# Patient Record
Sex: Female | Born: 1995 | Race: Black or African American | Hispanic: No | Marital: Single | Smoking: Former smoker
Health system: Southern US, Community
[De-identification: ages and names within clinical notes are randomized; demographics above are authoritative.]

## PROBLEM LIST (undated history)

## (undated) ENCOUNTER — Inpatient Hospital Stay (HOSPITAL_COMMUNITY): Payer: Self-pay

## (undated) DIAGNOSIS — S42309A Unspecified fracture of shaft of humerus, unspecified arm, initial encounter for closed fracture: Secondary | ICD-10-CM

## (undated) DIAGNOSIS — D649 Anemia, unspecified: Secondary | ICD-10-CM

---
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
## 2016-02-18 ENCOUNTER — Encounter (HOSPITAL_COMMUNITY): Payer: Self-pay | Admitting: *Deleted

## 2016-10-19 IMAGING — US US OB COMP LESS 14 WK
1 series · 13 of 28 positions shown · non-contrast
Comparison: None.

CLINICAL DATA: Lower abdominal pain

EXAM:
OBSTETRIC <14 WK US AND TRANSVAGINAL OB US
TECHNIQUE: Both transabdominal and transvaginal ultrasound examinations were
performed for complete evaluation of the gestation as well as the
maternal uterus, adnexal regions, and pelvic cul-de-sac.
Transvaginal technique was performed to assess early pregnancy.

[Series 1: us ob comp less 14 wk · 13 of 49 slices shown]
[im 2/49]
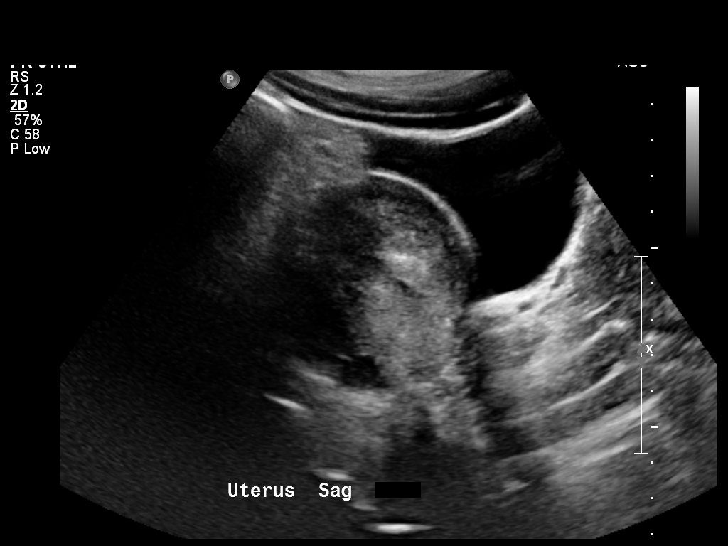
[im 6/49]
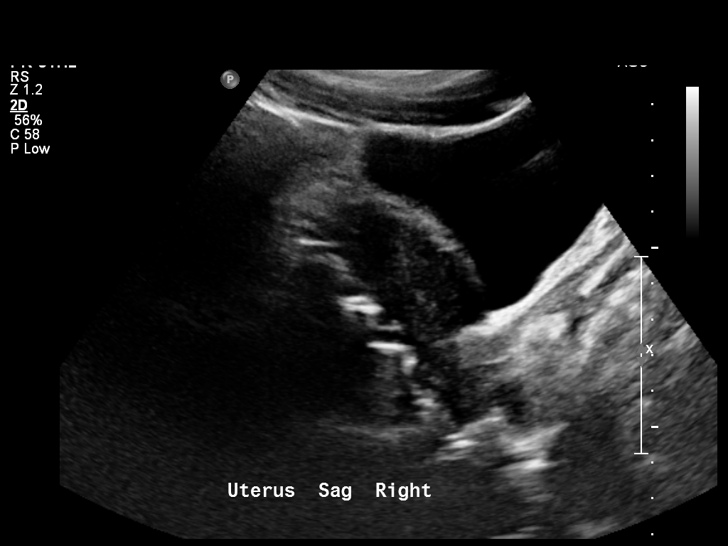
[im 9/49]
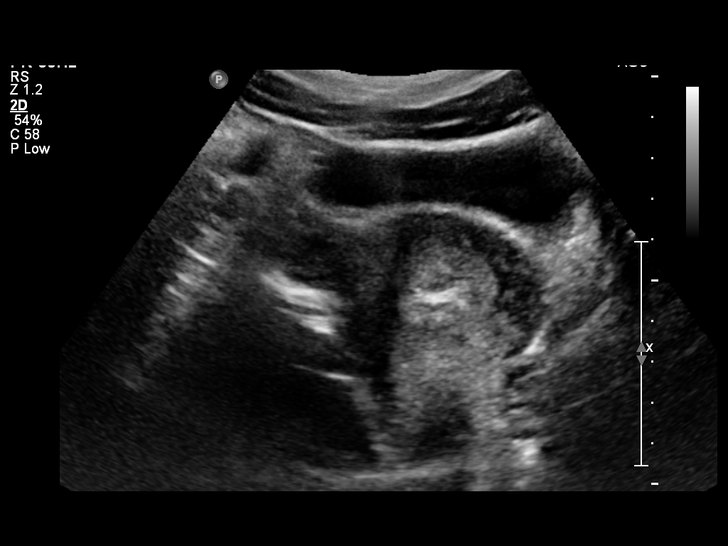
[im 13/49]
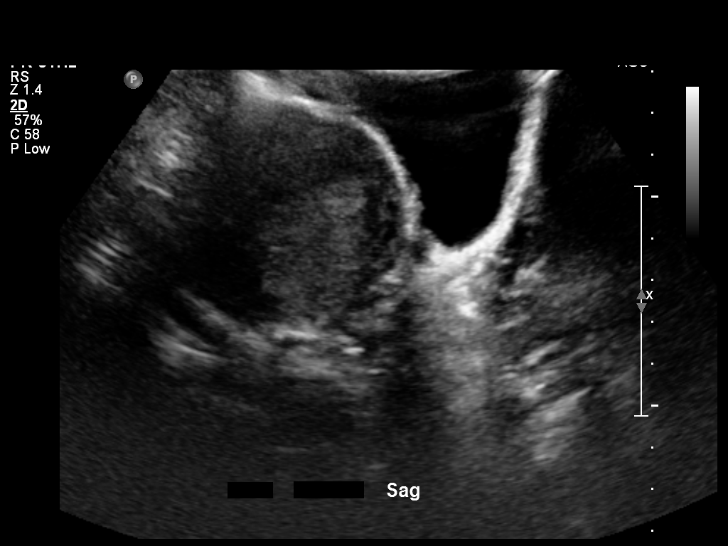
[im 17/49]
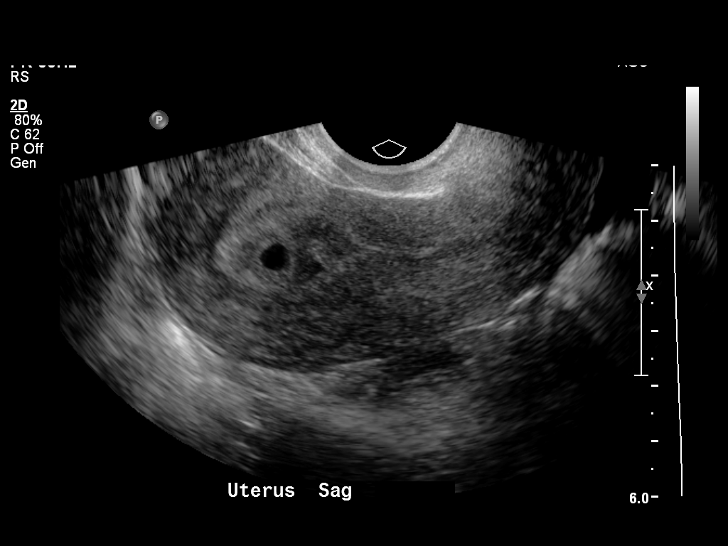
[im 20/49]
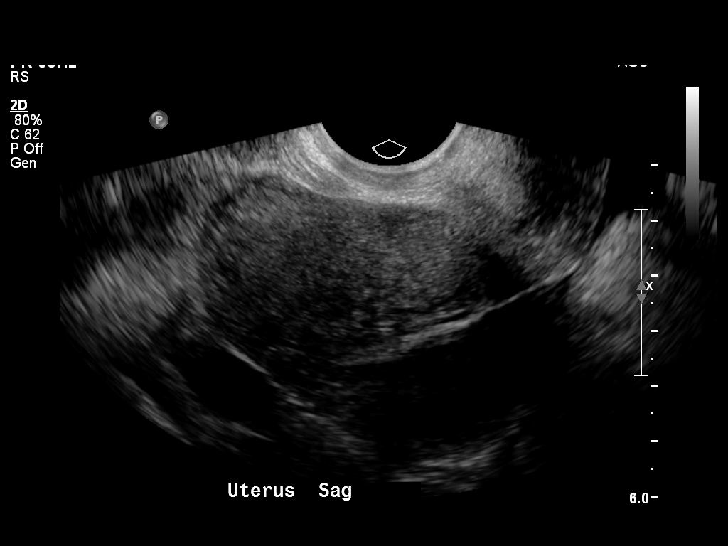
[im 25/49]
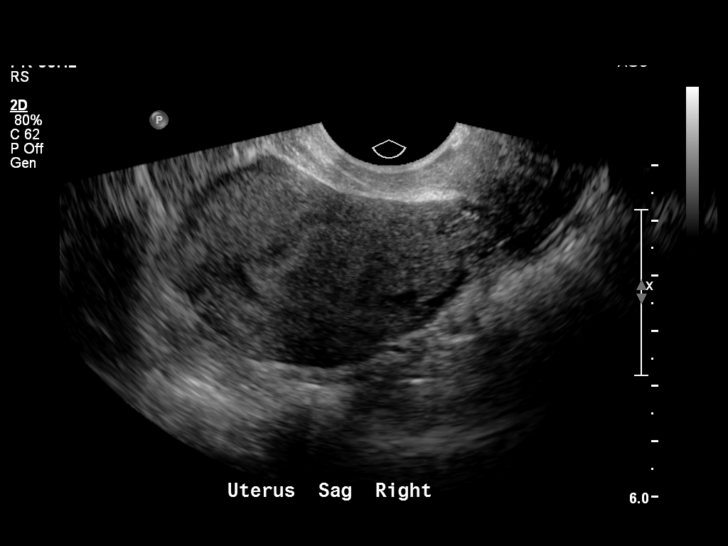
[im 29/49]
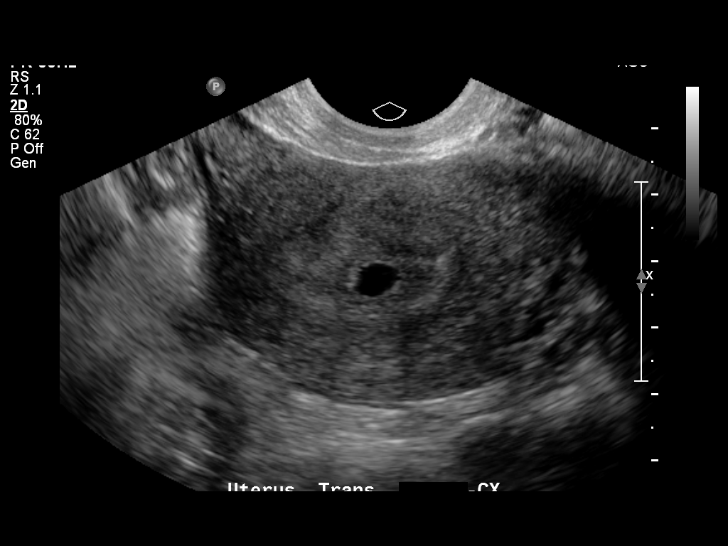
[im 33/49]
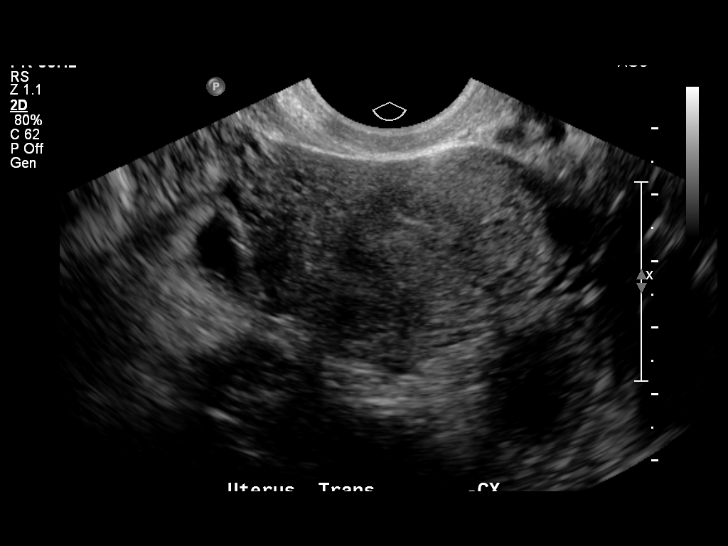
[im 36/49]
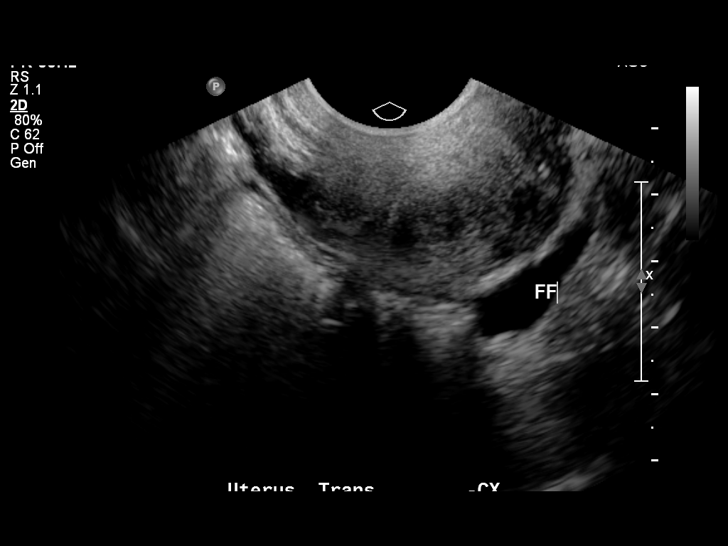
[im 40/49]
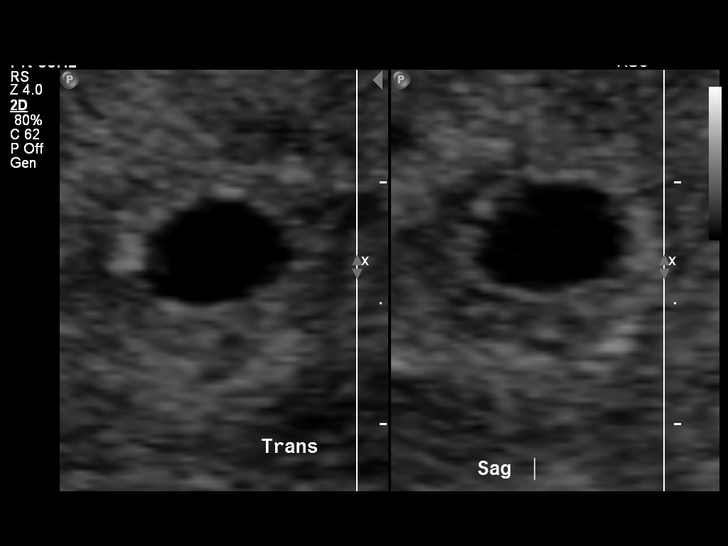
[im 43/49]
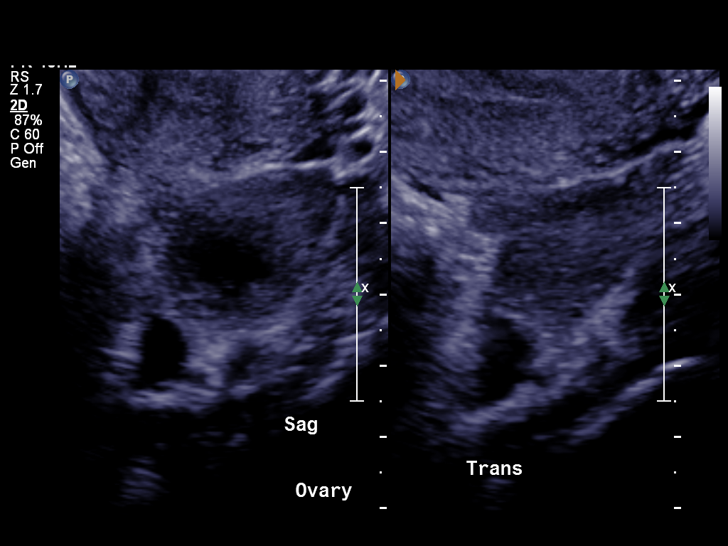
[im 47/49]
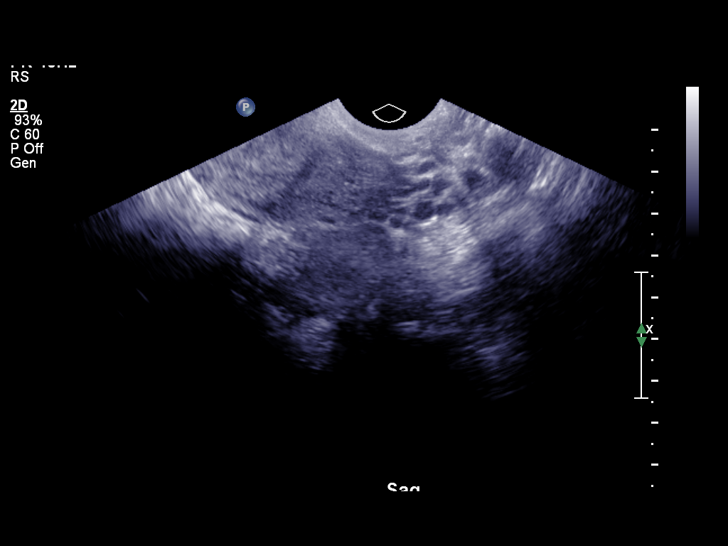

[13 of 28 positions shown; findings below may reference images not displayed]

FINDINGS: Intrauterine gestational sac: Visualized/normal in shape.

Yolk sac:  Not visualized

Embryo:  Not visualized

Cardiac Activity: Not visualized

MSD: 6 mm  mm   5 w   1  d

Maternal uterus/adnexae: There is a subchorionic hemorrhage
measuring 7 x 5 mm. The cervical os is closed. There are no
extrauterine pelvic or adnexal masses. Trace free pelvic fluid is
present.
IMPRESSION: Probable early intrauterine gestational sac, but no yolk sac, fetal
pole, or cardiac activity yet visualized. Recommend follow-up
quantitative B-HCG levels and follow-up US in 14 days to confirm and
assess viability. This recommendation follows SRU consensus
guidelines: Diagnostic Criteria for Nonviable Pregnancy Early in the
First Trimester. N Engl J Med 8851; [DATE]. Small subchorionic
hemorrhage. Trace maternal free pelvic fluid may be physiologic.

## 2016-10-29 IMAGING — US US OB TRANSVAGINAL
1 series · 14 of 28 positions shown · non-contrast
Comparison: Ultrasound 04/15/2015.

CLINICAL DATA: Inconclusive fetal viability. Pelvic cramping.
Quantitative beta HCG [DATE]. LMP 02/28/2015. Subsequent encounter.

EXAM:
TRANSVAGINAL OB ULTRASOUND
TECHNIQUE: Transvaginal ultrasound was performed for complete evaluation of the
gestation as well as the maternal uterus, adnexal regions, and
pelvic cul-de-sac.

[Series 1: us ob transvaginal · 14 of 43 slices shown]
[im 2/43]
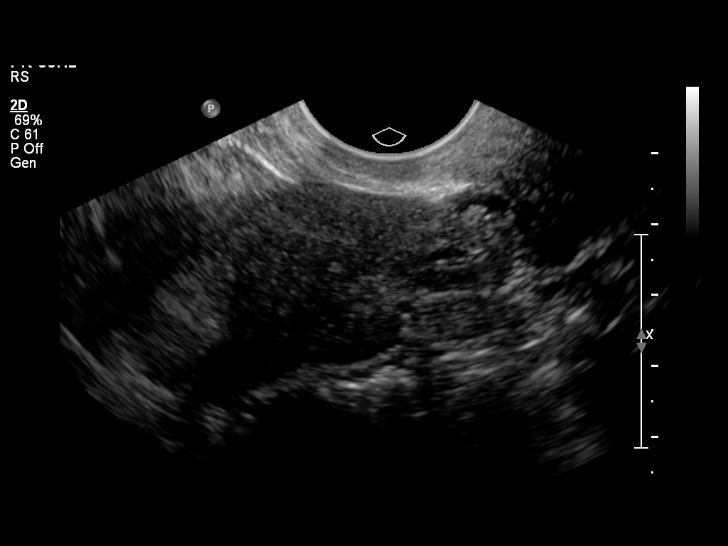
[im 5/43]
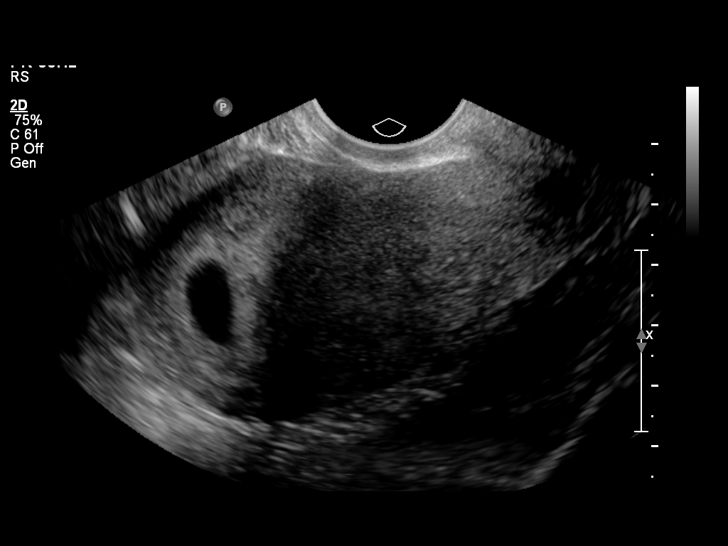
[im 8/43]
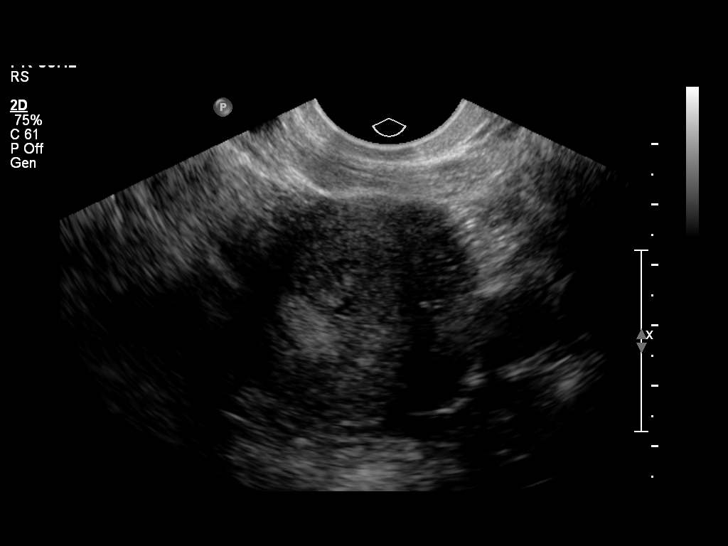
[im 11/43]
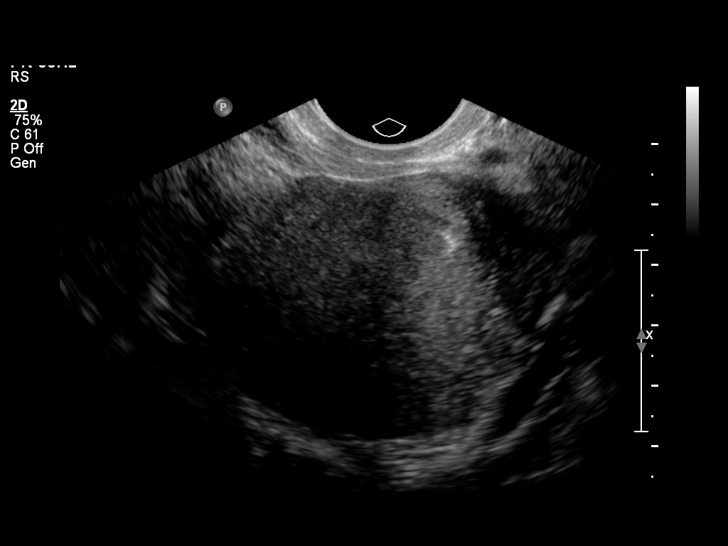
[im 15/43]
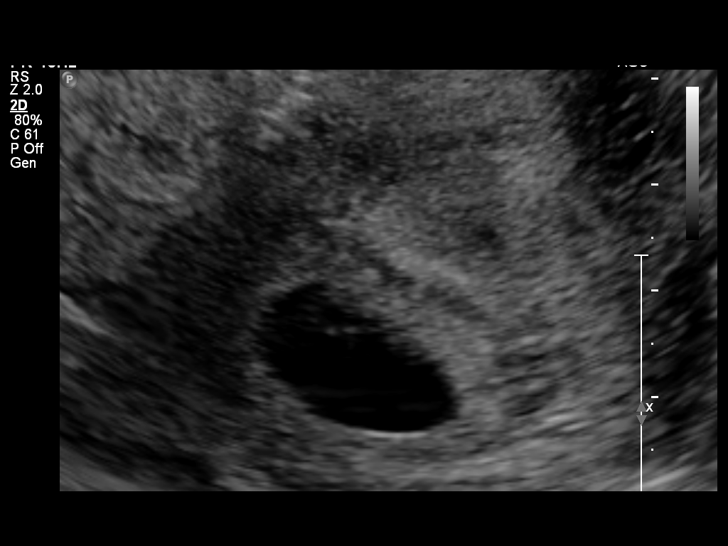
[im 18/43]
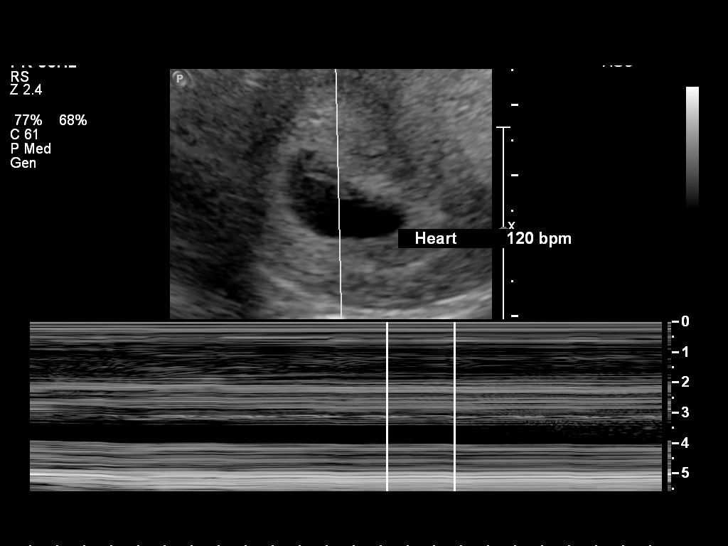
[im 21/43]
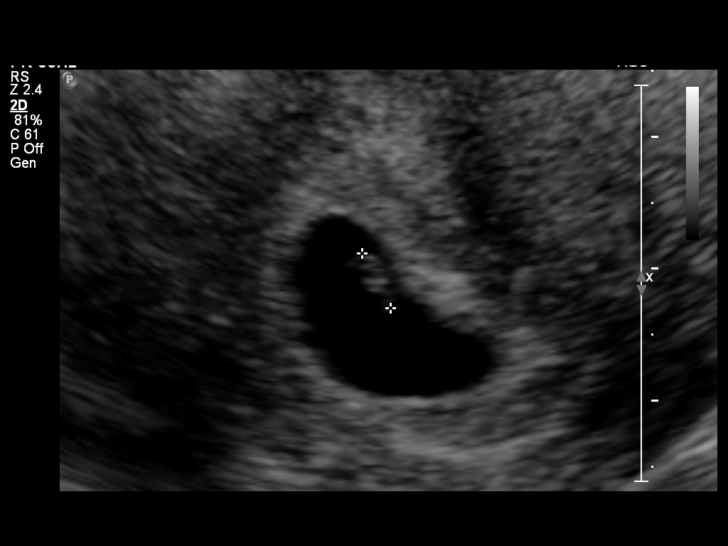
[im 24/43]
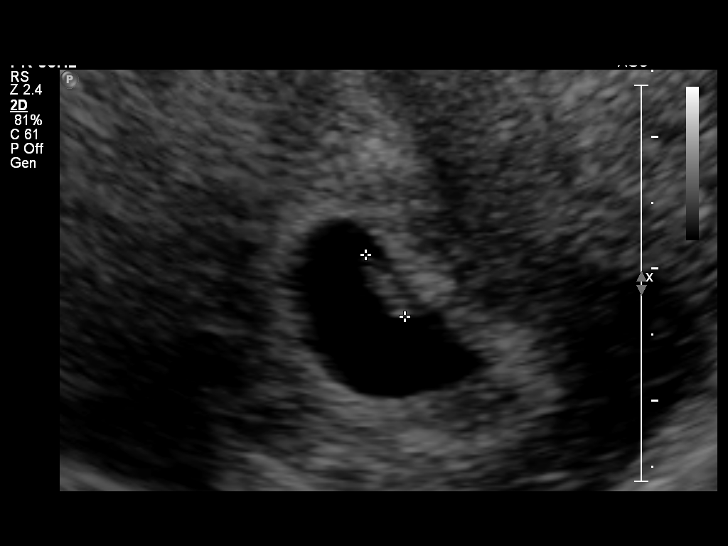
[im 27/43]
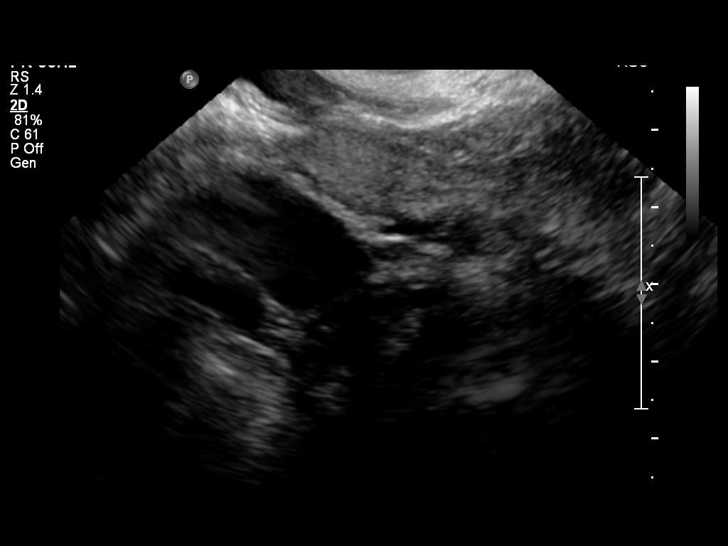
[im 30/43]
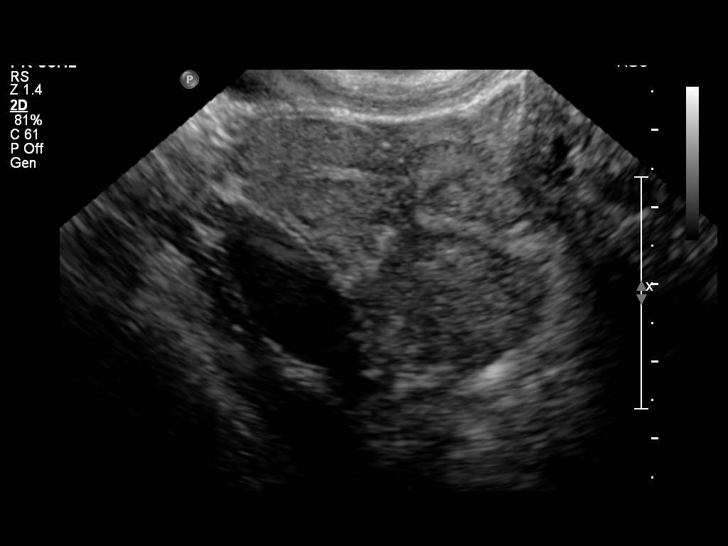
[im 33/43]
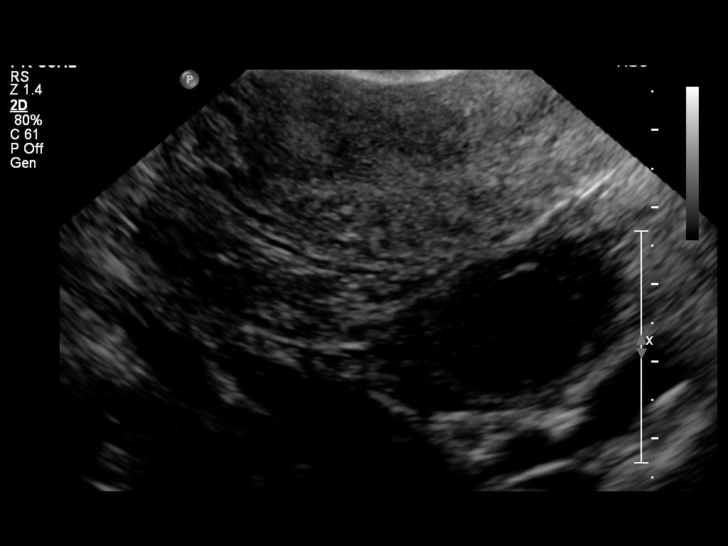
[im 36/43]
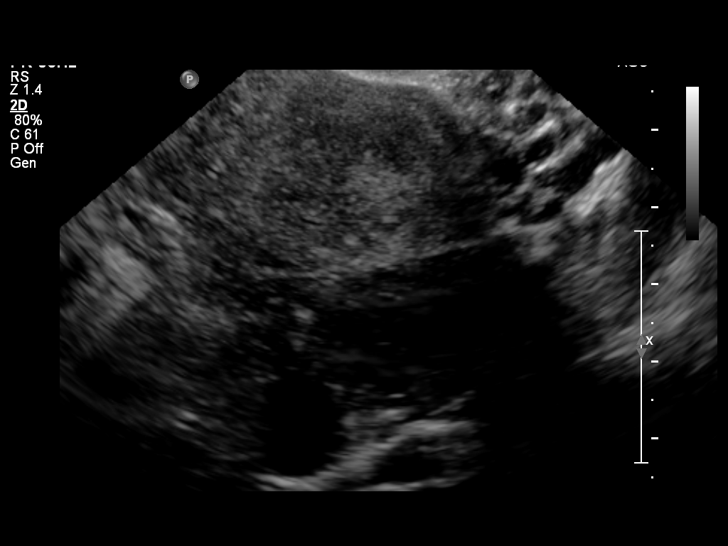
[im 39/43]
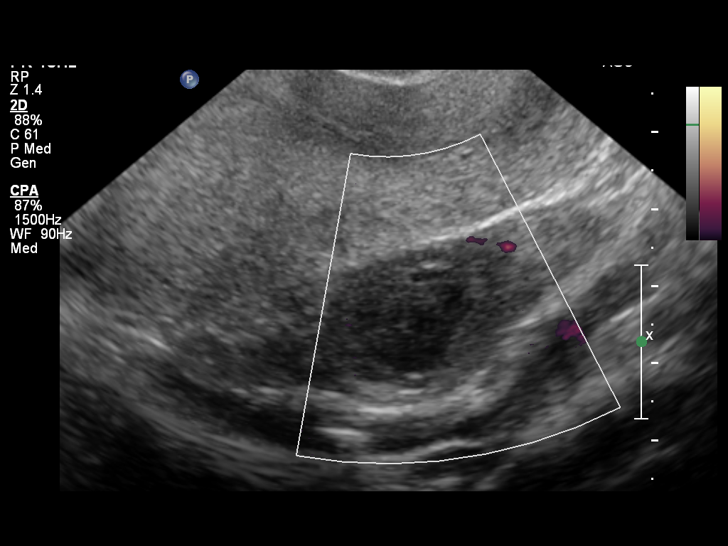
[im 43/43]
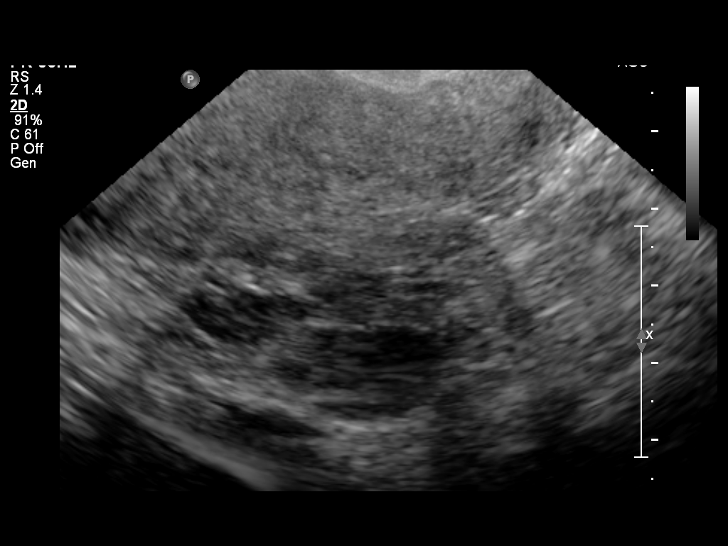

[14 of 28 positions shown; findings below may reference images not displayed]

FINDINGS: Intrauterine gestational sac: Visualized/normal in shape.

Yolk sac:  Visualized.

Embryo:  Visualized.

Cardiac Activity: Visualized.

Heart Rate: 120  bpm

CRL:  5.1  mm; 6 w 2 d;                  US EDC: 12/17/2015

Maternal uterus/adnexae: There is no significant subchorionic
hematoma. The right maternal ovary is not visualized. A corpus
luteal cyst is noted on the left. No adnexal mass. Trace free pelvic
fluid.
IMPRESSION: Single live intrauterine pregnancy with best estimated gestational
age of 6 weeks 2 days. No acute findings demonstrated.

## 2017-11-15 ENCOUNTER — Other Ambulatory Visit (HOSPITAL_COMMUNITY): Payer: Self-pay

## 2017-11-28 ENCOUNTER — Encounter (HOSPITAL_COMMUNITY): Payer: Self-pay | Admitting: *Deleted

## 2017-11-28 ENCOUNTER — Inpatient Hospital Stay (HOSPITAL_COMMUNITY)
Admission: AD | Admit: 2017-11-28 | Discharge: 2017-11-28 | Discharge status: Against Medical Advice | Payer: Medicaid Other | Source: Ambulatory Visit | Attending: Obstetrics & Gynecology | Admitting: Obstetrics & Gynecology

## 2017-11-28 DIAGNOSIS — Z5321 Procedure and treatment not carried out due to patient leaving prior to being seen by health care provider: Secondary | ICD-10-CM | POA: Diagnosis not present

## 2017-11-28 DIAGNOSIS — O26891 Other specified pregnancy related conditions, first trimester: Secondary | ICD-10-CM

## 2017-11-28 DIAGNOSIS — R109 Unspecified abdominal pain: Secondary | ICD-10-CM

## 2017-11-28 LAB — URINALYSIS, ROUTINE W REFLEX MICROSCOPIC
Bilirubin Urine: NEGATIVE
GLUCOSE, UA: NEGATIVE mg/dL
Hgb urine dipstick: NEGATIVE
KETONES UR: NEGATIVE mg/dL
NITRITE: NEGATIVE
PROTEIN: 30 mg/dL — AB
Specific Gravity, Urine: 1.024 (ref 1.005–1.030)
pH: 6 (ref 5.0–8.0)

## 2017-11-28 LAB — POCT PREGNANCY, URINE: PREG TEST UR: POSITIVE — AB

## 2017-11-28 NOTE — MAU Note (Signed)
Not in lobby

## 2017-11-28 NOTE — MAU Note (Signed)
Pt C/O lower abd pain since this morning, denies bleeding.  Pos HPT today.

## 2017-11-28 NOTE — MAU Note (Signed)
Not in lobby at 1635

## 2017-12-11 ENCOUNTER — Encounter: Payer: Self-pay | Admitting: Student

## 2017-12-11 ENCOUNTER — Ambulatory Visit (INDEPENDENT_AMBULATORY_CARE_PROVIDER_SITE_OTHER): Payer: Medicaid Other | Admitting: Student

## 2017-12-11 ENCOUNTER — Other Ambulatory Visit (HOSPITAL_COMMUNITY)
Admission: RE | Admit: 2017-12-11 | Discharge: 2017-12-11 | Disposition: A | Discharge status: Home / Self Care | Payer: Medicaid Other | Source: Ambulatory Visit | Attending: Student | Admitting: Student

## 2017-12-11 DIAGNOSIS — Z3481 Encounter for supervision of other normal pregnancy, first trimester: Secondary | ICD-10-CM

## 2017-12-11 DIAGNOSIS — Z3480 Encounter for supervision of other normal pregnancy, unspecified trimester: Secondary | ICD-10-CM | POA: Diagnosis present

## 2017-12-11 LAB — POCT URINALYSIS DIP (DEVICE)
Bilirubin Urine: NEGATIVE
Glucose, UA: NEGATIVE mg/dL
HGB URINE DIPSTICK: NEGATIVE
Ketones, ur: NEGATIVE mg/dL
LEUKOCYTES UA: NEGATIVE
Nitrite: POSITIVE — AB
Protein, ur: NEGATIVE mg/dL
SPECIFIC GRAVITY, URINE: 1.025 (ref 1.005–1.030)
Urobilinogen, UA: 0.2 mg/dL (ref 0.0–1.0)
pH: 6.5 (ref 5.0–8.0)

## 2017-12-11 MED ORDER — PREPLUS 27-1 MG PO TABS: 1.0000 | ORAL_TABLET | Freq: Every day | ORAL | 0 refills | Status: AC

## 2017-12-11 NOTE — Patient Instructions (Signed)

## 2017-12-12 LAB — OBSTETRIC PANEL, INCLUDING HIV
ANTIBODY SCREEN: NEGATIVE
Basophils Absolute: 0 10*3/uL (ref 0.0–0.2)
Basos: 0 %
EOS (ABSOLUTE): 0 10*3/uL (ref 0.0–0.4)
EOS: 1 %
HEMOGLOBIN: 10.7 g/dL — AB (ref 11.1–15.9)
HEP B S AG: NEGATIVE
HIV SCREEN 4TH GENERATION: NONREACTIVE
Hematocrit: 32.6 % — ABNORMAL LOW (ref 34.0–46.6)
IMMATURE GRANULOCYTES: 0 %
Immature Grans (Abs): 0 10*3/uL (ref 0.0–0.1)
LYMPHS ABS: 2 10*3/uL (ref 0.7–3.1)
Lymphs: 36 %
MCH: 30.4 pg (ref 26.6–33.0)
MCHC: 32.8 g/dL (ref 31.5–35.7)
MCV: 93 fL (ref 79–97)
MONOS ABS: 0.3 10*3/uL (ref 0.1–0.9)
Monocytes: 5 %
NEUTROS ABS: 3.1 10*3/uL (ref 1.4–7.0)
NEUTROS PCT: 58 %
Platelets: 239 10*3/uL (ref 150–379)
RBC: 3.52 x10E6/uL — AB (ref 3.77–5.28)
RDW: 13 % (ref 12.3–15.4)
RH TYPE: POSITIVE
RPR: NONREACTIVE
Rubella Antibodies, IGG: 3.14 index (ref >0.99)
WBC: 5.4 10*3/uL (ref 3.4–10.8)

## 2017-12-12 LAB — GC/CHLAMYDIA PROBE AMP (~~LOC~~) NOT AT ARMC
Chlamydia: NEGATIVE
Neisseria Gonorrhea: NEGATIVE

## 2017-12-13 NOTE — Progress Notes (Signed)
  Subjective:    Cindy Galvan is a G2P0100 6924w3d being seen today for her first obstetrical visit.  Her obstetrical history is significant for preterm delivery d/t placental abruption. Del[place] baby in [place].  Pregnancy history fully reviewed.  Patient reports no complaints.  Vitals:   12/11/17 1031  BP: 113/64  Pulse: 92  Weight: 150 lb 9.6 oz (68.3 kg)    HISTORY: OB History  Gravida Para Term Preterm AB Living  2 1   1       SAB TAB Ectopic Multiple Live Births               # Outcome Date GA Lbr Len/2nd Weight Sex Delivery Anes PTL Lv  2 Current           1 Preterm 10/29/15 2674w2d    CS-LTranv        Past Medical History:  Diagnosis Date  . Anemia   . Broken arm    left   Past Surgical History:  Procedure Laterality Date  . CESAREAN SECTION     Family History  Problem Relation Age of Onset  . Thyroid disease Mother      Exam     Skin: normal coloration and turgor, no rashes    Neurologic: oriented, normal, normal mood   Extremities: normal strength, tone, and muscle mass   Mouth/Teeth mucous membranes moist, pharynx normal without lesions and dental hygiene good   Neck supple and no masses   Cardiovascular: regular rate and rhythm, no murmurs or gallops   Respiratory:  appears well, vitals normal, no respiratory distress, acyanotic, normal RR, chest clear, no wheezing, crepitations, rhonchi, normal symmetric air entry   Abdomen: soft, non-tender; bowel sounds normal; no masses,  no organomegaly      Assessment:    Pregnancy: G2P0100 Patient Active Problem List   Diagnosis Date Noted  . Supervision of other normal pregnancy, antepartum 12/11/2017  . Sexually active at young age 41/13/2013     Patient is moving to [place] tomorrow. Encouraged patient to establish care in [place] ASAP. She reports that she plans on going to same OB she went to for first pregnancy. Reviewed ultrasound & genetic testing with her -- states she will schedule  imaging & future labs with her new OB.    Plan:  1. Supervision of other normal pregnancy, antepartum  - GC[facility]Chlamydia probe amp [facility] - Culture, OB Urine - Obstetric Panel, Including HIV - Prenatal Vit-Fe Fumarate-FA (PREPLUS) 27-1 MG TABS; Take 1 tablet by mouth daily.  Dispense: 30 tablet; Refill: 0   Judeth Hornrin Magali Bray 12/13/2017

## 2017-12-14 ENCOUNTER — Telehealth: Payer: Self-pay | Admitting: Student

## 2017-12-14 DIAGNOSIS — O2341 Unspecified infection of urinary tract in pregnancy, first trimester: Secondary | ICD-10-CM

## 2017-12-14 LAB — URINE CULTURE, OB REFLEX

## 2017-12-14 LAB — CULTURE, OB URINE

## 2017-12-14 MED ORDER — CEPHALEXIN 500 MG PO CAPS
500.0000 mg | ORAL_CAPSULE | Freq: Four times a day (QID) | ORAL | 0 refills | Status: AC
Start: 2017-12-14 — End: ?

## 2017-12-14 NOTE — Telephone Encounter (Signed)
Verified by name & DOB. Notified of positive urine culture. NKDA. RX sent to pharmacy of patient's choice. Questions answered.   Judeth HornLawrence, Amandeep Nesmith, NP

## 2017-12-17 ENCOUNTER — Encounter: Payer: Self-pay | Admitting: *Deleted

## 2018-01-08 ENCOUNTER — Encounter: Payer: Medicaid Other | Admitting: Family Medicine

## 2018-01-14 ENCOUNTER — Encounter: Payer: Self-pay | Admitting: Student

## 2018-02-03 ENCOUNTER — Encounter: Payer: Self-pay | Admitting: *Deleted

## 2018-02-24 ENCOUNTER — Telehealth: Payer: Self-pay | Admitting: General Practice

## 2018-02-24 NOTE — Telephone Encounter (Signed)
Patient called and left message on nurse line stating she needs her medical records faxed to her new doctor's office. Called patient and she states [facility] [place].  Asked patient if they have sent a ROI & she states they have. Told patient I will have our front office take care of this for her. Patient verbalized understanding and had no questions

## 2018-09-30 ENCOUNTER — Encounter (HOSPITAL_COMMUNITY): Payer: Self-pay
